# Patient Record
Sex: Female | Born: 1940 | Race: Black or African American | Hispanic: No | Marital: Single | Smoking: Never smoker
Health system: Southern US, Community
[De-identification: ages and names within clinical notes are randomized; demographics above are authoritative.]

---

## 2010-05-02 DIAGNOSIS — E119 Type 2 diabetes mellitus without complications: Secondary | ICD-10-CM | POA: Insufficient documentation

## 2010-05-20 DIAGNOSIS — H5200 Hypermetropia, unspecified eye: Secondary | ICD-10-CM | POA: Insufficient documentation

## 2010-05-20 DIAGNOSIS — H4010X0 Unspecified open-angle glaucoma, stage unspecified: Secondary | ICD-10-CM | POA: Insufficient documentation

## 2012-03-23 LAB — HM COLONOSCOPY

## 2012-06-06 DIAGNOSIS — E785 Hyperlipidemia, unspecified: Secondary | ICD-10-CM | POA: Insufficient documentation

## 2012-11-19 ENCOUNTER — Emergency Department: Payer: Self-pay | Admitting: Emergency Medicine

## 2012-11-19 LAB — COMPREHENSIVE METABOLIC PANEL WITH GFR
Albumin: 3.2 g/dL — ABNORMAL LOW
Alkaline Phosphatase: 60 U/L
Anion Gap: 6 — ABNORMAL LOW
BUN: 17 mg/dL
Bilirubin,Total: 0.3 mg/dL
Calcium, Total: 8.8 mg/dL
Chloride: 105 mmol/L
Co2: 28 mmol/L
Creatinine: 0.71 mg/dL
EGFR (African American): >60
EGFR (Non-African Amer.): >60
Glucose: 126 mg/dL — ABNORMAL HIGH
Osmolality: 281
Potassium: 3.3 mmol/L — ABNORMAL LOW
SGOT(AST): 19 U/L
SGPT (ALT): 26 U/L
Sodium: 139 mmol/L
Total Protein: 6.7 g/dL

## 2012-11-19 LAB — PROTIME-INR: Prothrombin Time: 12.7 secs (ref 11.5–14.7)

## 2012-11-19 LAB — CBC
HCT: 40.9 % (ref 35.0–47.0)
HGB: 13.7 g/dL (ref 12.0–16.0)
RDW: 14.3 % (ref 11.5–14.5)
WBC: 5.8 10*3/uL (ref 3.6–11.0)

## 2012-11-19 LAB — TROPONIN I: Troponin-I: <0.02 ng/mL

## 2012-11-19 LAB — TSH: Thyroid Stimulating Horm: 2.77 u[IU]/mL

## 2013-12-14 DIAGNOSIS — K219 Gastro-esophageal reflux disease without esophagitis: Secondary | ICD-10-CM | POA: Insufficient documentation

## 2014-07-30 IMAGING — CT CT CHEST W/ CM
1 series · 15 of 33 positions shown, 19 images · non-contrast
Comparison: none

REASON FOR EXAM: abnormality on chest xray
COMMENTS:

[Series 2: soft tissue · axial · 0.69mm/px · z∈[-126,+150]mm · 15 of 108 slices shown, 19 images]
[im 8/108  mediastinal]
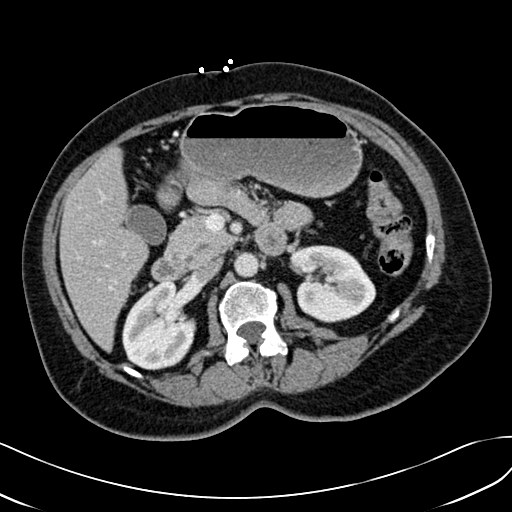
[im 8/108  lung]
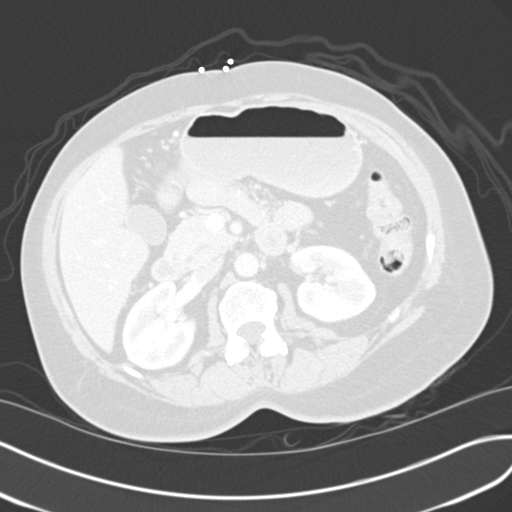
[im 16/108  lung]
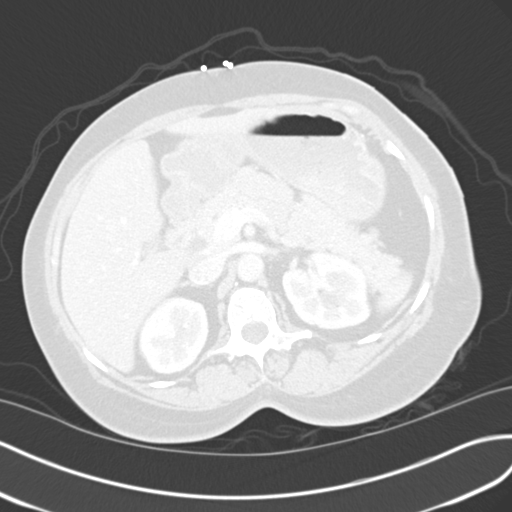
[im 22/108  lung]
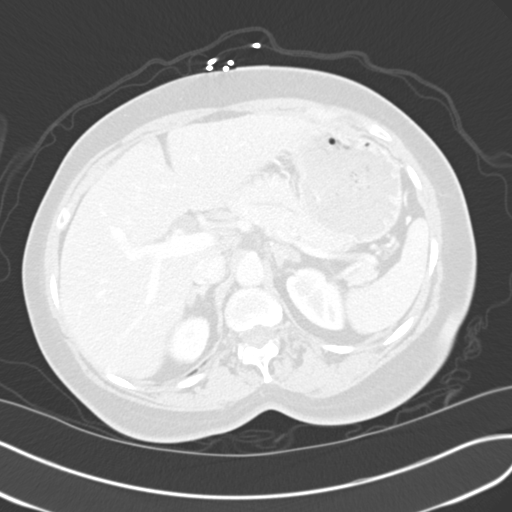
[im 28/108  lung]
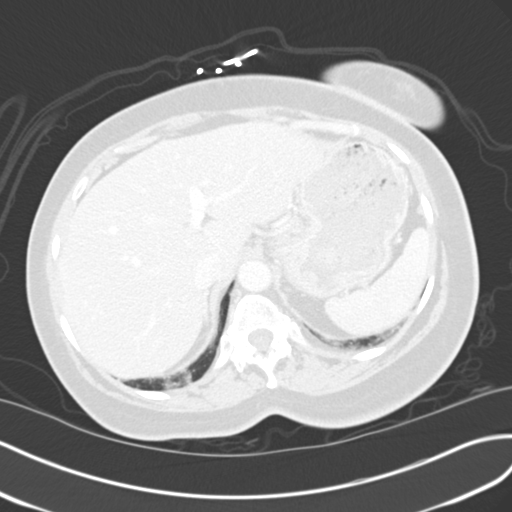
[im 36/108  mediastinal]
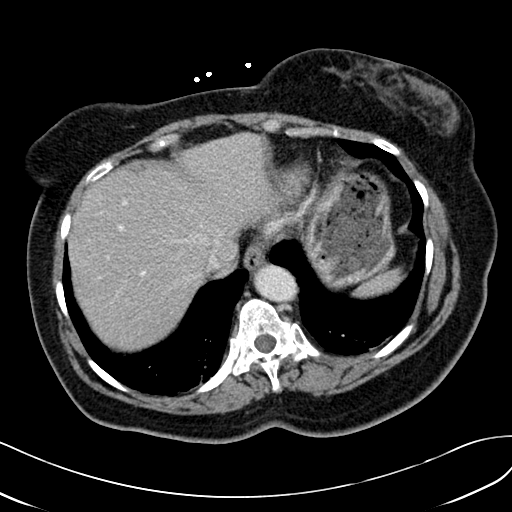
[im 36/108  lung]
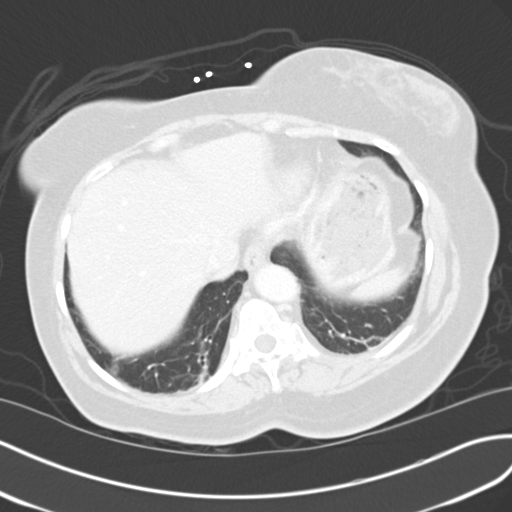
[im 43/108  lung]
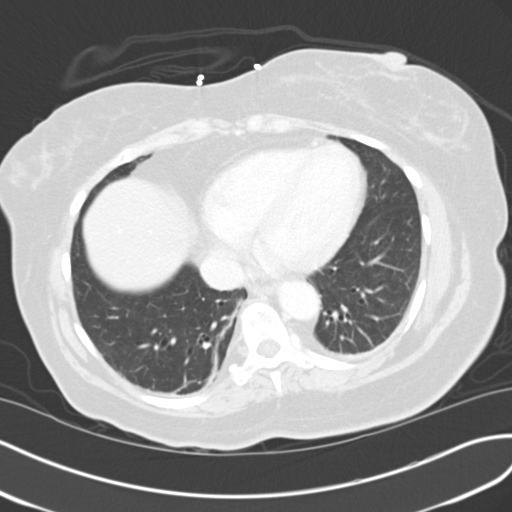
[im 48/108  lung]
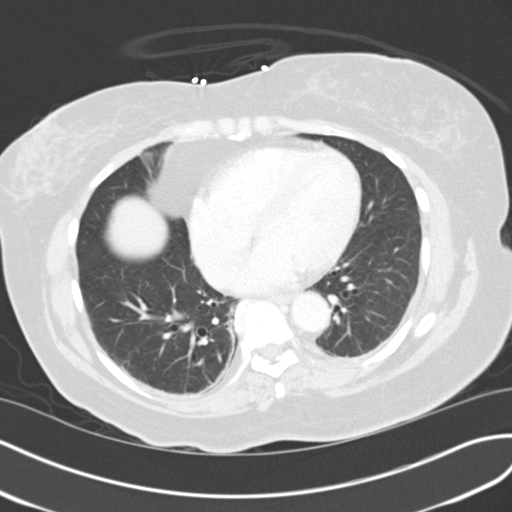
[im 56/108  lung]
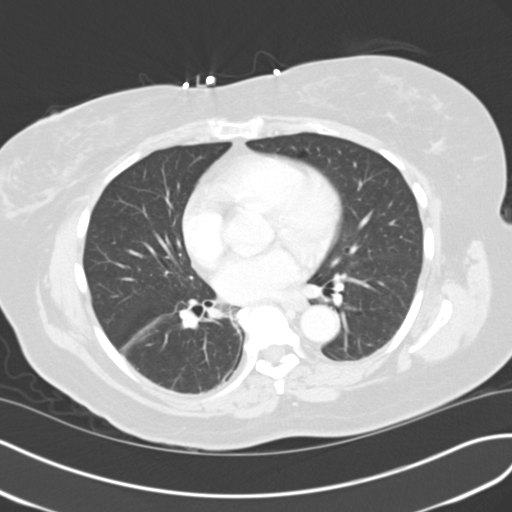
[im 60/108  mediastinal]
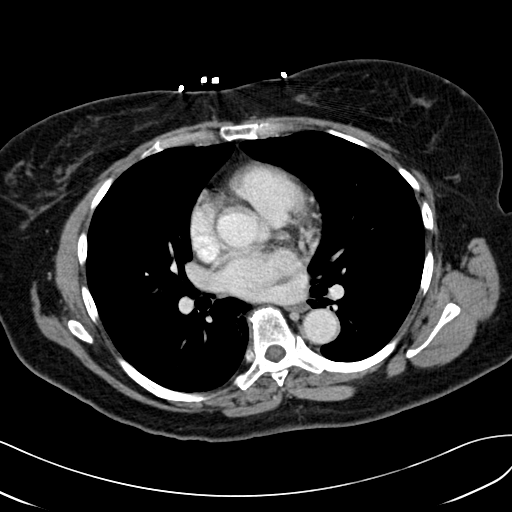
[im 60/108  lung]
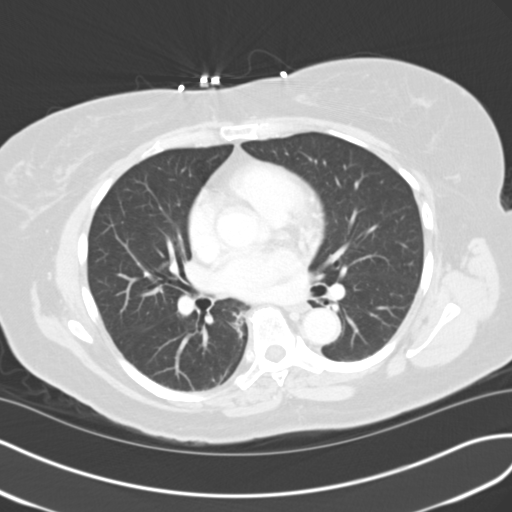
[im 65/108  lung]
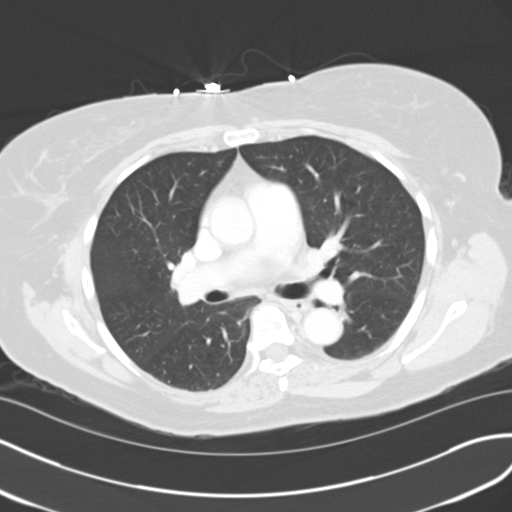
[im 72/108  lung]
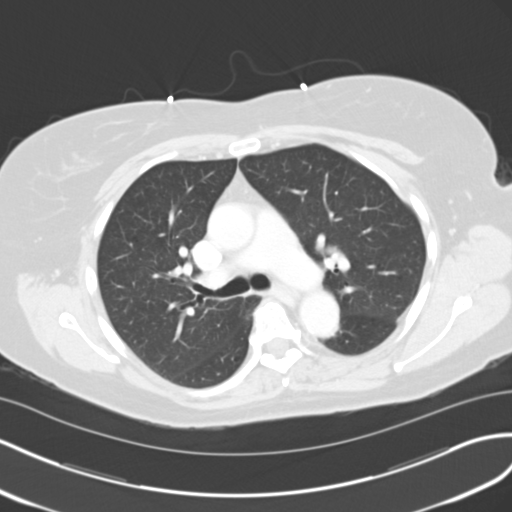
[im 80/108  lung]
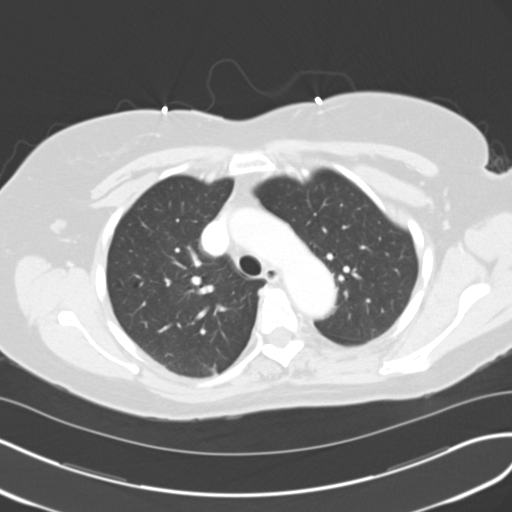
[im 86/108  mediastinal]
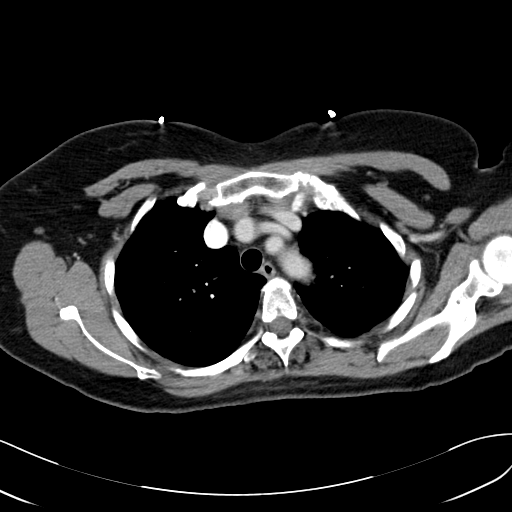
[im 86/108  lung]
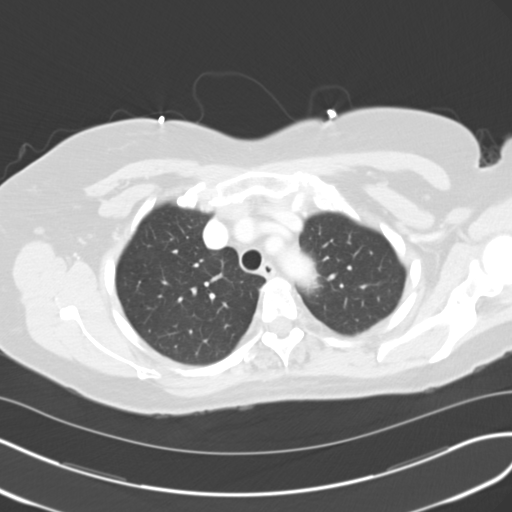
[im 92/108  lung]
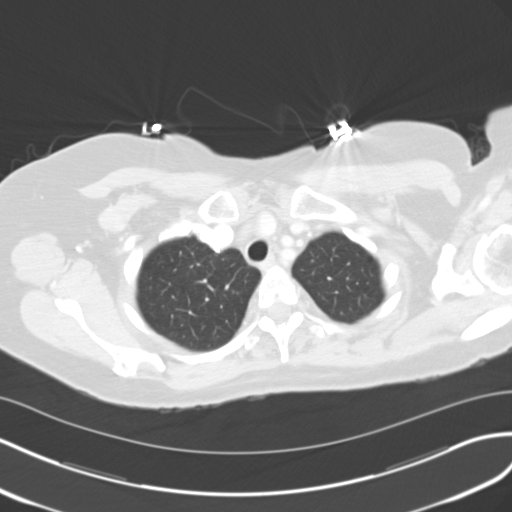
[im 100/108  lung]
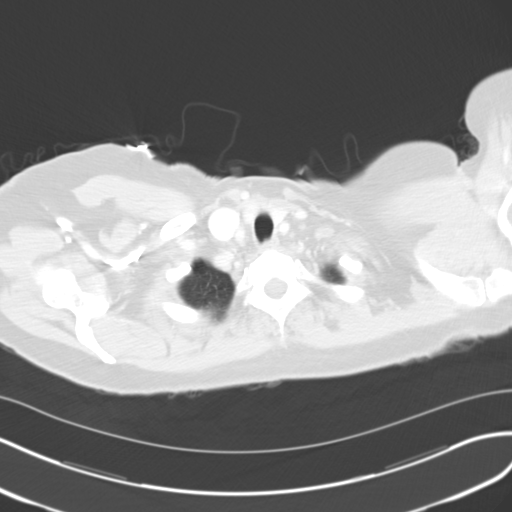

[15 of 33 positions shown; findings below may reference images not displayed]

PROCEDURE:     CT  - CT CHEST WITH CONTRAST  - November 19, 2012  [DATE]

RESULT:     Axial CT scanning was performed through the chest with
reconstructions at 3 mm intervals and slice thicknesses. Review of
multiplanar reconstructed images was performed separately on the VIA
monitor. The patient received 75 cc of [id number]-H57 intravenously.

At lung window settings there is no alveolar infiltrate. There is
atelectasis versus fibrosis in the posterior aspects of both lower lobes. No
suspicious pulmonary parenchymal nodules are demonstrated. At mediastinal
window settings contrast within the thoracic aorta exhibits a normal
pattern. Contrast within the pulmonary arterial tree is grossly normal
though the timing of the bolus is not ideal. No pathologic sized mediastinal
or hilar lymph nodes are evident. There is no pleural nor part pericardial
effusion. The retrosternal soft tissues are normal in appearance.

Within the upper abdomen the observed portions of the liver, gallbladder,
spleen, and adrenal glands are normal in appearance. There is likely a cyst
in the midpole of the left kidney.

The thoracic vertebral bodies are preserved in height. There are small
lateral osteophytes on the right at multiple levels.
IMPRESSION: 1. Hilar prominence on the chest x-ray is likely related to the right main
pulmonary artery.
2. No mediastinal or hilar lymphadenopathy or other masses are demonstrated.
The thoracic aorta is normal in appearance.
4. There is no evidence of CHF.
5. There are coarse lung markings in the infrahilar regions posteriorly
which may reflect atelectasis or scarring.

[REDACTED]

## 2014-10-02 DIAGNOSIS — Z87898 Personal history of other specified conditions: Secondary | ICD-10-CM | POA: Insufficient documentation

## 2015-04-24 LAB — HM MAMMOGRAPHY

## 2017-02-03 DIAGNOSIS — Z0000 Encounter for general adult medical examination without abnormal findings: Secondary | ICD-10-CM | POA: Insufficient documentation

## 2017-02-03 DIAGNOSIS — I872 Venous insufficiency (chronic) (peripheral): Secondary | ICD-10-CM | POA: Insufficient documentation

## 2017-02-03 LAB — MICROALBUMIN, URINE: MICROALB UR: 0.6

## 2017-02-03 LAB — BASIC METABOLIC PANEL
BUN: 13 (ref 4–21)
CREATININE: 0.6 (ref <=1.1)
Glucose: 97
POTASSIUM: 4 (ref 3.4–5.3)
Sodium: 141 (ref 137–147)

## 2017-02-03 LAB — HEMOGLOBIN A1C: Hemoglobin A1C: 6.8

## 2017-03-31 DIAGNOSIS — R42 Dizziness and giddiness: Secondary | ICD-10-CM | POA: Insufficient documentation

## 2017-04-20 DIAGNOSIS — H1031 Unspecified acute conjunctivitis, right eye: Secondary | ICD-10-CM | POA: Insufficient documentation

## 2017-04-23 ENCOUNTER — Ambulatory Visit: Payer: Self-pay | Admitting: Nurse Practitioner

## 2017-04-30 ENCOUNTER — Ambulatory Visit: Payer: Self-pay | Admitting: Family Medicine

## 2017-04-30 ENCOUNTER — Other Ambulatory Visit: Payer: Self-pay

## 2017-04-30 ENCOUNTER — Encounter: Payer: Self-pay | Admitting: Family Medicine

## 2017-10-01 ENCOUNTER — Encounter: Payer: Self-pay | Admitting: Emergency Medicine

## 2017-10-01 ENCOUNTER — Emergency Department
Admission: EM | Admit: 2017-10-01 | Discharge: 2017-10-01 | Disposition: A | Discharge status: Home / Self Care | Payer: Medicare HMO | Attending: Emergency Medicine | Admitting: Emergency Medicine

## 2017-10-01 ENCOUNTER — Other Ambulatory Visit: Payer: Self-pay

## 2017-10-01 DIAGNOSIS — E876 Hypokalemia: Secondary | ICD-10-CM | POA: Insufficient documentation

## 2017-10-01 DIAGNOSIS — Z7982 Long term (current) use of aspirin: Secondary | ICD-10-CM | POA: Diagnosis not present

## 2017-10-01 DIAGNOSIS — Z79899 Other long term (current) drug therapy: Secondary | ICD-10-CM | POA: Insufficient documentation

## 2017-10-01 DIAGNOSIS — R42 Dizziness and giddiness: Secondary | ICD-10-CM | POA: Diagnosis present

## 2017-10-01 DIAGNOSIS — E119 Type 2 diabetes mellitus without complications: Secondary | ICD-10-CM | POA: Diagnosis not present

## 2017-10-01 DIAGNOSIS — Z7984 Long term (current) use of oral hypoglycemic drugs: Secondary | ICD-10-CM | POA: Insufficient documentation

## 2017-10-01 DIAGNOSIS — I10 Essential (primary) hypertension: Secondary | ICD-10-CM | POA: Insufficient documentation

## 2017-10-01 LAB — CBC WITH DIFFERENTIAL/PLATELET
BASOS PCT: 1 %
Basophils Absolute: 0 10*3/uL (ref 0–0.1)
EOS ABS: 0 10*3/uL (ref 0–0.7)
EOS PCT: 1 %
HCT: 41.9 % (ref 35.0–47.0)
Hemoglobin: 13.7 g/dL (ref 12.0–16.0)
LYMPHS ABS: 1.1 10*3/uL (ref 1.0–3.6)
Lymphocytes Relative: 12 %
MCH: 30.7 pg (ref 26.0–34.0)
MCHC: 32.7 g/dL (ref 32.0–36.0)
MCV: 93.9 fL (ref 80.0–100.0)
MONOS PCT: 6 %
Monocytes Absolute: 0.5 10*3/uL (ref 0.2–0.9)
NEUTROS PCT: 82 %
Neutro Abs: 7.7 10*3/uL — ABNORMAL HIGH (ref 1.4–6.5)
PLATELETS: 238 10*3/uL (ref 150–440)
RBC: 4.46 MIL/uL (ref 3.80–5.20)
RDW: 14 % (ref 11.5–14.5)
WBC: 9.5 10*3/uL (ref 3.6–11.0)

## 2017-10-01 LAB — MAGNESIUM: Magnesium: 2.2 mg/dL (ref 1.7–2.4)

## 2017-10-01 LAB — COMPREHENSIVE METABOLIC PANEL
ALT: 33 U/L (ref 0–44)
AST: 33 U/L (ref 15–41)
Albumin: 3.7 g/dL (ref 3.5–5.0)
Alkaline Phosphatase: 51 U/L (ref 38–126)
Anion gap: 8 (ref 5–15)
BUN: 19 mg/dL (ref 8–23)
CHLORIDE: 108 mmol/L (ref 98–111)
CO2: 25 mmol/L (ref 22–32)
CREATININE: 0.57 mg/dL (ref 0.44–1.00)
Calcium: 8.8 mg/dL — ABNORMAL LOW (ref 8.9–10.3)
GFR calc non Af Amer: >60 mL/min (ref >60)
Glucose, Bld: 171 mg/dL — ABNORMAL HIGH (ref 70–99)
POTASSIUM: 3.1 mmol/L — AB (ref 3.5–5.1)
SODIUM: 141 mmol/L (ref 135–145)
Total Bilirubin: 0.7 mg/dL (ref 0.3–1.2)
Total Protein: 6.8 g/dL (ref 6.5–8.1)

## 2017-10-01 LAB — TROPONIN I

## 2017-10-01 MED ORDER — IBUPROFEN 600 MG PO TABS
ORAL_TABLET | ORAL | Status: AC
  Filled 2017-10-01: qty 1

## 2017-10-01 MED ORDER — SODIUM CHLORIDE 0.9 % IV BOLUS
500.0000 mL | Freq: Once | INTRAVENOUS | Status: AC
  Administered 2017-10-01: 500 mL via INTRAVENOUS

## 2017-10-01 MED ORDER — POTASSIUM CHLORIDE CRYS ER 20 MEQ PO TBCR
40.0000 meq | EXTENDED_RELEASE_TABLET | Freq: Once | ORAL | Status: AC
Start: 2017-10-01 — End: 2017-10-01
  Administered 2017-10-01: 40 meq via ORAL
  Filled 2017-10-01: qty 2

## 2017-10-01 MED ORDER — IBUPROFEN 600 MG PO TABS
600.0000 mg | ORAL_TABLET | Freq: Once | ORAL | Status: AC
  Administered 2017-10-01: 600 mg via ORAL

## 2017-10-01 MED ORDER — POTASSIUM CHLORIDE CRYS ER 20 MEQ PO TBCR: 20.0000 meq | EXTENDED_RELEASE_TABLET | Freq: Every day | ORAL | 0 refills | Status: AC

## 2017-10-01 NOTE — ED Provider Notes (Signed)
[facility] Emergency Department Provider Note  ____________________________________________   First MD Initiated Contact with Patient 10/01/17 1711     (approximate)  I have reviewed the triage vital signs and the nursing notes.   HISTORY  Chief Complaint Dizziness and Emesis    HPI Haley Simpson is a 77 y.o. female with medical history as listed below who presents by EMS after an episode where she felt weak and dizzy and had some nausea and vomiting.  She reports that she has been moving all week and feels like she may be affected by the heat.  She was sitting in her car this afternoon when she became acutely dizzy and threw up a couple of times.  She had similar symptoms back in March and had an extensive work-up including an MRI and cardiac work-up and everything came back normal.  The onset was acute and her symptoms were severe but she feels completely back to normal at this time but just felt like she should get checked out.  She has had no numbness nor weakness.  She does not have a headache.  She denies chest pain, shortness of breath, and abdominal pain.   History reviewed. No pertinent past medical history.  Patient Active Problem List   Diagnosis Date Noted  . Acute bacterial conjunctivitis of right eye 04/20/2017  . Vertigo 03/31/2017  . Routine health maintenance 02/03/2017  . Venous stasis dermatitis of both lower extremities 02/03/2017  . History of unsteady gait 10/02/2014  . Esophageal reflux 12/14/2013  . Hyperlipidemia 06/06/2012  . 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 Open-angle glaucoma 05/20/2010  . Type II diabetes mellitus (HCC) 05/02/2010  . Hypertension, benign 11/13/2003    History reviewed. No pertinent surgical history.  Prior to Admission medications   Medication Sig Start Date End Date Taking? Authorizing Provider  ACCU-CHEK AVIVA PLUS test strip  04/22/17   [provider]  ACCU-CHEK SOFTCLIX LANCETS lancets   04/20/17   [provider]  amLODipine (NORVASC) 10 MG tablet Take 10 mg by mouth. 06/02/16   [provider]  ASPIRIN 81 PO Take 81 mg by mouth. 11/13/03   [provider]  atorvastatin (LIPITOR) 10 MG tablet Take 10 mg by mouth. 02/03/17 02/03/18  [provider]  Cholecalciferol (VITAMIN D-1000 MAX ST) 1000 units tablet Take by mouth. 05/27/10   [provider]  erythromycin ophthalmic ointment  04/20/17   [provider]  hydrochlorothiazide (HYDRODIURIL) 25 MG tablet Take 25 mg by mouth. 02/03/17   [provider]  losartan (COZAAR) 25 MG tablet Take 25 mg by mouth daily.    [provider]  meclizine (ANTIVERT) 25 MG tablet  04/18/17   [provider]  metFORMIN (GLUCOPHAGE) 500 MG tablet 2 before bfk and supper 02/03/17   [provider]  potassium chloride SA (KLOR-CON M20) 20 MEQ tablet Take 1 tablet (20 mEq total) by mouth daily. 10/01/17   Loleta RoseForbach, Thelonious Kauffmann, MD  timolol (TIMOPTIC) 0.5 % ophthalmic solution Administer 1 drop to both eyes Two (2) times a day. 05/23/15   [provider]    Allergies Enalapril and Nitrofurantoin  Family History  Problem Relation Age of Onset  . Cancer Daughter        breast  . Stroke Father     Social History Social History   Tobacco Use  . Smoking status: Never Smoker  . Smokeless tobacco: Never Used  Substance Use Topics  . Alcohol use: No    Frequency:  Never  . Drug use: No    Review of Systems Constitutional: No fever/chills Eyes: No visual changes. ENT: No sore throat. Cardiovascular: Denies chest pain. Respiratory: Denies shortness of breath. Gastrointestinal: Acute onset nausea and vomiting, now resolved.  No abdominal pain.  No diarrhea.  No constipation. Genitourinary: Negative for dysuria. Musculoskeletal: Negative for neck pain.  Negative for back pain. Integumentary: Negative for rash. Neurological: Acute onset dizziness and feeling  flushed, negative for headaches, focal weakness or numbness.   ____________________________________________   PHYSICAL EXAM:  VITAL SIGNS: ED Triage Vitals  Enc Vitals Group     BP 10/01/17 1644 (!) 171/55     Pulse Rate 10/01/17 1644 66     Resp 10/01/17 1644 16     Temp 10/01/17 1644 98.5 F (36.9 C)     Temp Source 10/01/17 1644 Oral     SpO2 10/01/17 1644 98 %     Weight 10/01/17 1643 61.7 kg (136 lb)     Height 10/01/17 1643 1.626 m (5\' 4" )     Head Circumference --      Peak Flow --      Pain Score --      Pain Loc --      Pain Edu? --      Excl. in GC? --     Constitutional: Alert and oriented. Well appearing and in no acute distress. Eyes: Conjunctivae are normal.  Head: Atraumatic. Nose: No congestion/rhinnorhea. Mouth/Throat: Mucous membranes are moist. Neck: No stridor.  No meningeal signs.   Cardiovascular: Normal rate, regular rhythm. Good peripheral circulation. Grossly normal heart sounds. Respiratory: Normal respiratory effort.  No retractions. Lungs CTAB. Gastrointestinal: Soft and nontender. No distention.  Musculoskeletal: No lower extremity tenderness nor edema. No gross deformities of extremities. Neurologic:  Normal speech and language. No gross focal neurologic deficits are appreciated.  Skin:  Skin is warm, dry and intact. No rash noted. Psychiatric: Mood and affect are normal. Speech and behavior are normal.  ____________________________________________   LABS (all labs ordered are listed, but only abnormal results are displayed)  Labs Reviewed  CBC WITH DIFFERENTIAL/PLATELET - Abnormal; Notable for the following components:      Result Value   Neutro Abs 7.7 (*)    All other components within normal limits  COMPREHENSIVE METABOLIC PANEL - Abnormal; Notable for the following components:   Potassium 3.1 (*)    Glucose, Bld 171 (*)    Calcium 8.8 (*)    All other components within normal limits  MAGNESIUM  TROPONIN I    ____________________________________________  EKG  ED ECG REPORT I, Loleta Rose, the attending physician, personally viewed and interpreted this ECG.  Date: 10/01/2017 EKG Time: 16: 42  Rate: 65 Rhythm: normal sinus rhythm QRS Axis: normal Intervals: normal, LVH, prolonged QTc with 558 ms ST/T Wave abnormalities: Non-specific ST segment / T-wave changes, but no evidence of acute ischemia. Narrative Interpretation: no evidence of acute ischemia   ____________________________________________  RADIOLOGY   ED MD interpretation:  No indication for imaging  Official radiology report(s): No results found.  ____________________________________________   PROCEDURES  Critical Care performed: No   Procedure(s) performed:   Procedures   ____________________________________________   INITIAL IMPRESSION / ASSESSMENT AND PLAN / ED COURSE  As part of my medical decision making, I reviewed the following data within the electronic MEDICAL RECORD NUMBER Nursing notes reviewed and incorporated, Labs reviewed , EKG interpreted , Old chart reviewed and Notes from prior ED visits    Differential  diagnosis includes, but is not limited to, vasovagal episode, overexertion and mild dehydration, ACS, acute infectious process.  The patient is completely asymptomatic upon arrival and has had at least one similar episode in the past where she had an extensive work-up including both cardiac and intracranial including an MRI.  She was scared by the acuity of the episode but it sounds mostly vasovagal in the setting of moving her home, working in the heat, decreased oral intake, etc.  In fact she states that as long as she is eating 3 meals a day, she does not feel dizzy.  Recently she has not been able to get her meals regularly and she is feeling symptomatic.  I gave her a meal tray and she is feeling better.  She has a very mild headache for which she got ibuprofen 600 mg by mouth.  I gave her  normal saline 500 mL IV and she feels much better.  EKG is within normal limits and all of her lab work including her troponin is normal except for mild hypokalemia for which I provided potassium supplement, 40 mEq by mouth in the ED and a supplement to take when she goes home.  I encourage close outpatient follow-up and gave my usual customary return precautions.  She understands and agrees with the plan.     ____________________________________________  FINAL CLINICAL IMPRESSION(S) / ED DIAGNOSES  Final diagnoses:  Dizziness  Hypokalemia     MEDICATIONS GIVEN DURING THIS VISIT:  Medications  sodium chloride 0.9 % bolus 500 mL (0 mLs Intravenous Stopped 10/01/17 1920)  ibuprofen (ADVIL,MOTRIN) tablet 600 mg (600 mg Oral Given 10/01/17 1924)  potassium chloride SA (K-DUR,KLOR-CON) CR tablet 40 mEq (40 mEq Oral Given 10/01/17 1930)     ED Discharge Orders        Ordered    potassium chloride SA (KLOR-CON M20) 20 MEQ tablet  Daily     10/01/17 1925       Note:  This document was prepared using Dragon voice recognition software and may include unintentional dictation errors.    Loleta Rose, MD 10/01/17 2017

## 2017-10-01 NOTE — ED Notes (Signed)
Pt ambulatory to bathroom at this time.

## 2017-10-01 NOTE — ED Triage Notes (Signed)
Pt reports working all week in the heat moving. Today sitting in her car pt became dizzy and vomited. All symptoms resolved at this time. Reports an episode like this in March,

## 2017-10-01 NOTE — ED Notes (Signed)
Pt provided with sandwich tray and ginger ale to drink. Pt denies further needs at this time. Will continue to monitor.

## 2017-10-01 NOTE — Discharge Instructions (Signed)
Your workup in the Emergency Department today was reassuring.  We did not find any specific abnormalities.  We recommend you drink plenty of fluids, take your regular medications and/or any new ones prescribed today, and follow up with the doctor(s) listed in these documents as recommended.  Return to the Emergency Department if you develop new or worsening symptoms that concern you.  

## 2023-12-21 ENCOUNTER — Other Ambulatory Visit (HOSPITAL_BASED_OUTPATIENT_CLINIC_OR_DEPARTMENT_OTHER): Payer: Self-pay | Admitting: Nurse Practitioner

## 2023-12-21 DIAGNOSIS — Z780 Asymptomatic menopausal state: Secondary | ICD-10-CM

## 2024-03-07 ENCOUNTER — Ambulatory Visit: Admitting: Podiatry

## 2024-03-10 ENCOUNTER — Ambulatory Visit: Admitting: Podiatry

## 2024-03-10 DIAGNOSIS — M79671 Pain in right foot: Secondary | ICD-10-CM

## 2024-03-10 DIAGNOSIS — B351 Tinea unguium: Secondary | ICD-10-CM | POA: Diagnosis not present

## 2024-03-10 DIAGNOSIS — L600 Ingrowing nail: Secondary | ICD-10-CM | POA: Diagnosis not present

## 2024-03-10 DIAGNOSIS — M79672 Pain in left foot: Secondary | ICD-10-CM

## 2024-03-10 NOTE — Progress Notes (Signed)
 Patient presents for evaluation and treatment of tenderness and some redness around nails feet.  Tenderness around toes with walking and wearing shoes.  Complains of pain along the medial border of the hallux nail right.  Has been sore with walking and wearing shoes.  Physical exam:  General appearance: Alert, pleasant, and in no acute distress.  Vascular: Pedal pulses: DP 2/4 B/L, PT 2/4 B/L.  Mild edema lower legs bilaterally.  Capillary refill time immediate bilaterally  Neurologic:  Dermatologic:  Nails thickened, disfigured, discolored 1-5 BL with subungual debris.  Redness and hypertrophic nail folds along nail folds bilaterally but no signs of drainage or infection.  Incurvated nail border especially on the medial border of the hallux nail right.  Considerable amount of dry skin debris along the nail fold with incurvated border.  No signs of bacterial infection or drainage.  No bleeding noted.  Musculoskeletal:  Mild hallux valgus bilaterally hammertoes 2 through 5 bilaterally.  Normal muscle strength lower extremity bilaterally   Diagnosis: 1. Painful onychomycotic nails 1 through 5 bilaterally. 2. Pain toes 1 through 5 bilaterally. 3.  Ingrown medial nail border hallux nail right  Plan: -New patient office visit for evaluation and management level 3.  Modifier 25. - Discussed with the ingrown nail on the hallux right.  Will debride the nail border today and see if this reduces the pain.  Recommended she soak it once or twice a day in warm Epsom salt water for about 15 minutes.  Hopefully this will resolve the discomfort around the nail.  If it does not we will probably need to do a matricectomy on the medial nail border.  If is not better in 2 weeks she can call and we can evaluate it  -Debrided onychomycotic nails 1 through 5 bilaterally.  Sharply debrided nails with nail clipper and reduced with a power bur.  Return 3 months [facility]

## 2024-03-10 NOTE — Patient Instructions (Signed)

## 2024-03-30 ENCOUNTER — Emergency Department (HOSPITAL_COMMUNITY)
Admission: EM | Admit: 2024-03-30 | Discharge: 2024-03-30 | Disposition: A | Discharge status: Home / Self Care | Attending: Emergency Medicine | Admitting: Emergency Medicine

## 2024-03-30 ENCOUNTER — Other Ambulatory Visit: Payer: Self-pay

## 2024-03-30 ENCOUNTER — Encounter (HOSPITAL_COMMUNITY): Payer: Self-pay

## 2024-03-30 ENCOUNTER — Emergency Department (HOSPITAL_COMMUNITY)

## 2024-03-30 DIAGNOSIS — R079 Chest pain, unspecified: Secondary | ICD-10-CM | POA: Diagnosis present

## 2024-03-30 DIAGNOSIS — E119 Type 2 diabetes mellitus without complications: Secondary | ICD-10-CM | POA: Insufficient documentation

## 2024-03-30 DIAGNOSIS — Z7982 Long term (current) use of aspirin: Secondary | ICD-10-CM | POA: Diagnosis not present

## 2024-03-30 DIAGNOSIS — Z79899 Other long term (current) drug therapy: Secondary | ICD-10-CM | POA: Diagnosis not present

## 2024-03-30 DIAGNOSIS — I10 Essential (primary) hypertension: Secondary | ICD-10-CM | POA: Diagnosis not present

## 2024-03-30 DIAGNOSIS — Z7984 Long term (current) use of oral hypoglycemic drugs: Secondary | ICD-10-CM | POA: Insufficient documentation

## 2024-03-30 LAB — BASIC METABOLIC PANEL WITH GFR
Anion gap: 8 (ref 5–15)
BUN: 17 mg/dL (ref 8–23)
CO2: 27 mmol/L (ref 22–32)
Calcium: 9 mg/dL (ref 8.9–10.3)
Chloride: 106 mmol/L (ref 98–111)
Creatinine, Ser: 0.71 mg/dL (ref 0.44–1.00)
GFR, Estimated: >60 mL/min
Glucose, Bld: 134 mg/dL — ABNORMAL HIGH (ref 70–99)
Potassium: 4.2 mmol/L (ref 3.5–5.1)
Sodium: 141 mmol/L (ref 135–145)

## 2024-03-30 LAB — CBC
HCT: 46.5 % — ABNORMAL HIGH (ref 36.0–46.0)
Hemoglobin: 14.8 g/dL (ref 12.0–15.0)
MCH: 30.1 pg (ref 26.0–34.0)
MCHC: 31.8 g/dL (ref 30.0–36.0)
MCV: 94.7 fL (ref 80.0–100.0)
Platelets: 254 K/uL (ref 150–400)
RBC: 4.91 MIL/uL (ref 3.87–5.11)
RDW: 13.4 % (ref 11.5–15.5)
WBC: 7.9 K/uL (ref 4.0–10.5)
nRBC: 0 % (ref 0.0–0.2)

## 2024-03-30 LAB — TROPONIN T, HIGH SENSITIVITY
Troponin T High Sensitivity: <15 ng/L (ref 0–19)
Troponin T High Sensitivity: <15 ng/L (ref 0–19)

## 2024-03-30 MED ORDER — HYDRALAZINE HCL 20 MG/ML IJ SOLN
10.0000 mg | Freq: Once | INTRAMUSCULAR | Status: AC
  Administered 2024-03-30: 10 mg via INTRAVENOUS
  Filled 2024-03-30: qty 1

## 2024-03-30 MED ORDER — METFORMIN HCL 500 MG PO TABS: 500.0000 mg | ORAL_TABLET | Freq: Two times a day (BID) | ORAL | 0 refills | Status: AC

## 2024-03-30 MED ORDER — LOSARTAN POTASSIUM 25 MG PO TABS: 50.0000 mg | ORAL_TABLET | Freq: Every day | ORAL | 0 refills | Status: AC

## 2024-03-30 NOTE — Discharge Instructions (Addendum)
 We have increased your losartan  to 50 mg daily.  Follow up with your PCP within the next day or so

## 2024-03-30 NOTE — ED Provider Triage Note (Signed)
 Emergency Medicine Provider Triage Evaluation Note  Haley Simpson , a 84 y.o. female  was evaluated in triage.  Pt complains of elevated blood pressure.  Had some chest pressure last night radiated to the left.  No chest pain currently.  No shortness of breath.  Reportedly compliant with her antihypertensives. Review of Systems  Positive: Chest pain Negative: Shortness of breath  Physical Exam  BP (!) 219/76 (BP Location: Right Arm)   Pulse (!) 56   Temp 98.7 F (37.1 C) (Oral)   Resp 16   Ht 5' 4 (1.626 m)   Wt 59 kg   SpO2 100%   BMI 22.31 kg/m  Gen:   Awake, no distress   Resp:  Normal effort  MSK:   Moves extremities without difficulty  Other:  No lower extremity edema.  Medical Decision Making  Medically screening exam initiated at 10:42 AM.  Appropriate orders placed.  Haley Simpson was informed that the remainder of the evaluation will be completed by another provider, this initial triage assessment does not replace that evaluation, and the importance of remaining in the ED until their evaluation is complete.  84 year old concern for elevated blood pressure.  Had some chest pain last night, none currently and is essentially asymptomatic.  Screening labs ordered.   Haley Simpson PARAS, DO 03/30/24 1042

## 2024-03-30 NOTE — ED Provider Notes (Addendum)
 " [place] [facility] Provider Note   CSN: [id number] Arrival date & time: 03/30/24  9044     Patient presents with: Chest Pain   Haley Simpson is a 84 y.o. female.   The patient, with hypertension, presents with chest pain.  She reports ventral chest pain radiating down her left arm, starting in her hand and moving up to her chest, with episodes severe enough to wake her from sleep. She also notes intermittent swelling from thumb to elbow with prominent veins, though prior evaluations found no cause. She has no current chest pain, headache, vision changes, weakness, dyspnea, palpitations, or leg swelling.  She has hypertension and recently had a blood pressure of 228/153 mmHg, after which she took two baby aspirin plus her losartan  and atenolol. She does not routinely monitor blood pressure at home and says such high readings are unusual for her.  She has diabetes and plans to see a specialist for management. She was prescribed a diuretic in the past for leg swelling and has leg discoloration treated with a topical ointment. She did not take the diuretic today.  She has glaucoma and cataracts and uses prescribed eye drops regularly.   Chest Pain      Prior to Admission medications  Medication Sig Start Date End Date Taking? Authorizing Provider  losartan  (COZAAR ) 25 MG tablet Take 2 tablets (50 mg total) by mouth daily. 03/30/24 04/29/24 Yes Dreama Longs, MD  ACCU-CHEK AVIVA PLUS test strip  04/22/17   [provider]  ACCU-CHEK SOFTCLIX LANCETS lancets  04/20/17   [provider]  ASPIRIN 81 PO Take 81 mg by mouth. 11/13/03   [provider]  atorvastatin (LIPITOR) 10 MG tablet Take 10 mg by mouth. 02/03/17 03/10/24  [provider]  Cholecalciferol (VITAMIN D-1000 MAX ST) 1000 units tablet Take by mouth. 05/27/10   [provider]  erythromycin ophthalmic ointment  04/20/17   [provider]   meclizine (ANTIVERT) 25 MG tablet  04/18/17   [provider]  metFORMIN  (GLUCOPHAGE ) 500 MG tablet Take 1 tablet (500 mg total) by mouth 2 (two) times daily with a meal. 03/30/24 04/29/24  Dreama Longs, MD  potassium chloride  SA (KLOR-CON  M20) 20 MEQ tablet Take 1 tablet (20 mEq total) by mouth daily. 10/01/17   Gordan Huxley, MD  timolol (TIMOPTIC) 0.5 % ophthalmic solution Administer 1 drop to both eyes Two (2) times a day. 05/23/15   [provider]    Allergies: Enalapril and Nitrofurantoin    Review of Systems  Cardiovascular:  Positive for chest pain.    Updated Vital Signs BP (!) 226/87   Pulse (!) 58   Temp 98.2 F (36.8 C)   Resp 16   Ht 5' 4 (1.626 m)   Wt 59 kg   SpO2 100%   BMI 22.31 kg/m   Physical Exam Constitutional:      General: She is not in acute distress.    Appearance: She is well-developed and normal weight. She is not toxic-appearing.  Cardiovascular:     Rate and Rhythm: Normal rate and regular rhythm.     Heart sounds: Normal heart sounds. No murmur heard. Pulmonary:     Effort: Pulmonary effort is normal. No tachypnea.     Breath sounds: Normal breath sounds.  Abdominal:     General: Bowel sounds are normal.     Palpations: Abdomen is soft.  Musculoskeletal:     Cervical back: Normal range of  motion.  Neurological:     Mental Status: She is alert and oriented to person, place, and time. Mental status is at baseline.     Cranial Nerves: Cranial nerves 2-12 are intact.     Sensory: Sensation is intact.     Motor: No weakness.     Coordination: Coordination is intact.     Deep Tendon Reflexes: Reflexes are normal and symmetric.     (all labs ordered are listed, but only abnormal results are displayed) Labs Reviewed  BASIC METABOLIC PANEL WITH GFR - Abnormal; Notable for the following components:      Result Value   Glucose, Bld 134 (*)    All other components within normal limits  CBC - Abnormal; Notable for the following  components:   HCT 46.5 (*)    All other components within normal limits  TROPONIN T, HIGH SENSITIVITY  TROPONIN T, HIGH SENSITIVITY    EKG: EKG Interpretation Date/Time:  Thursday March 30 2024 10:28:40 EST Ventricular Rate:  53 PR Interval:  196 QRS Duration:  76 QT Interval:  448 QTC Calculation: 420 R Axis:   2  Text Interpretation: Sinus bradycardia Minimal voltage criteria for LVH, may be normal variant ( R in aVL ) Borderline ECG When compared with ECG of 01-Oct-2017 16:42, No significant change since last tracing Confirmed by Dreama Longs ([id number]) on 03/30/2024 4:14:55 PM  Radiology: [facility] Chest 2 View Result Date: 03/30/2024 CLINICAL DATA:  Chest pain. EXAM: CHEST - 2 VIEW COMPARISON:  11/19/2012 FINDINGS: Lungs are adequately inflated without focal airspace consolidation or effusion. Cardiomediastinal silhouette and remainder of the exam is unchanged. IMPRESSION: No active cardiopulmonary disease. Electronically Signed   By: Toribio Agreste M.D.   On: 03/30/2024 11:04     Procedures   Medications Ordered in the ED  hydrALAZINE  (APRESOLINE ) injection 10 mg (10 mg Intravenous Given 03/30/24 1709)                                    Medical Decision Making Amount and/or Complexity of Data Reviewed Labs: ordered. Radiology: ordered.  Risk Prescription drug management.   Haley Simpson is a 85 y.o. female presenting for chest pain, now resolved, and elevated BP. Patient is asymptomatic. Initial BP 226/87. Lab work with normal troponin x2, unremarkable BMP and CBC. EKG showing NSR without ST changes. CXR with no acute process. On exam she is well appearing with no focal neurological or cardiac findings. Differential includes ACS, dissection, medication non-compliance.   ACS ruled out with normal EKG, troponin. Less likely to be dissection due to normal CXR, resolution of pain and stable VS. No signs of hypertensive emergency as patient is neurologically intact without signs  of end organ damage. Suspect medication confusion for worsening blood pressure as her PCP is not prescribing the losartan  but the patient reports taking losartan  that was prescribed early last year.   Plan to treat hypertension with one time dose of hydral 10 mg IV. Will recheck BP after. If stabilized can DC home with PCP follow up. Will refill metformin  and increase losartan  to 50 mg daily.   On reevaluation, BP was 176/58. Patient feels more comfortably returning home with PCP follow up. Her daughter will drive her home.      Final diagnoses:  Hypertension, unspecified type  Chest pain, unspecified type    ED Discharge Orders          Ordered  losartan  (COZAAR ) 25 MG tablet  Daily        03/30/24 1656    metFORMIN  (GLUCOPHAGE ) 500 MG tablet  2 times daily with meals        03/30/24 1656               Cleotilde Perkins, DO 03/30/24 1756    Cleotilde Perkins, DO 03/30/24 MANON    Dreama Longs, MD 03/31/24 1254  "

## 2024-03-30 NOTE — ED Triage Notes (Signed)
 Pt referred to ED from PCP via [facility]. Pt reports that at 0300 she was awoken from sleep for midsternal CP radiating down her L arm. Pt denies pain currently.   EMS last VS - 180/90, HR 60, 98% on RA.

## 2024-06-08 ENCOUNTER — Ambulatory Visit: Admitting: Podiatry
